# Patient Record
Sex: Female | Born: 1985 | Race: Asian | Hispanic: No | Marital: Married | Smoking: Never smoker
Health system: Southern US, Community
[De-identification: ages and names within clinical notes are randomized; demographics above are authoritative.]

## PROBLEM LIST (undated history)

## (undated) DIAGNOSIS — O24419 Gestational diabetes mellitus in pregnancy, unspecified control: Secondary | ICD-10-CM

## (undated) DIAGNOSIS — Z789 Other specified health status: Secondary | ICD-10-CM

## (undated) HISTORY — DX: Gestational diabetes mellitus in pregnancy, unspecified control: O24.419

## (undated) HISTORY — PX: APPENDECTOMY: SHX54

---

## 2011-12-02 ENCOUNTER — Ambulatory Visit: Payer: Self-pay | Admitting: Physician Assistant

## 2011-12-02 VITALS — BP 110/75 | HR 109 | Temp 98.4°F | Resp 16 | Ht 60.0 in | Wt 95.0 lb

## 2011-12-02 DIAGNOSIS — N912 Amenorrhea, unspecified: Secondary | ICD-10-CM

## 2011-12-02 DIAGNOSIS — Z331 Pregnant state, incidental: Secondary | ICD-10-CM

## 2011-12-02 DIAGNOSIS — Z3490 Encounter for supervision of normal pregnancy, unspecified, unspecified trimester: Secondary | ICD-10-CM

## 2011-12-02 DIAGNOSIS — J029 Acute pharyngitis, unspecified: Secondary | ICD-10-CM

## 2011-12-02 DIAGNOSIS — J069 Acute upper respiratory infection, unspecified: Secondary | ICD-10-CM

## 2011-12-02 MED ORDER — PRENATAL VITAMINS (DIS) PO TABS: ORAL_TABLET | ORAL | Status: AC

## 2011-12-02 NOTE — Progress Notes (Signed)
Patient ID: Lisa Christensen MRN: [id number], DOB: 1985/09/14, 26 y.o. Date of Encounter: 12/02/2011, 2:18 PM  Primary Physician: No primary provider on file.  Chief Complaint:  Chief Complaint  Patient presents with  . URI    x 4-5 days    HPI: 26 y.o. year old female recently moved here from [place] presents with 4-5 day history of nasal congestion, post nasal drip, sore throat, sinus pressure, and cough. Afebrile. No chills. Nasal congestion thick and green/yellow. Cough is sometimes productive of green sputum and not associated with time of day. Has not tried any medications at home due to a recent positive at home pregnancy test. No GI complaints. Appetite slightly decreased. Plans to marry boyfriend in the room with her currently. Boyfriend with similar symptoms prior, now he is feeling better. No recent antibiotics. No hemoptysis, night sweats, prolonged cough, or fatigue. Generally healthy. She immigrated to the [place] on her own free will and is here today on her own free will. She does feel safe. No abdominal pain or bleeding.    No leg trauma, sedentary periods, h/o cancer, or tobacco use.   History reviewed. No pertinent past medical history.   Home Meds: Prior to Admission medications   Not on File    Allergies: No Known Allergies  History   Social History  . Marital Status: Significant Other    Spouse Name: N/A    Number of Children: N/A  . Years of Education: N/A   Occupational History  . Not on file.   Social History Main Topics  . Smoking status: Never Smoker   . Smokeless tobacco: Never Used  . Alcohol Use: No  . Drug Use: No  . Sexually Active: Yes    Birth Control/ Protection: None   Other Topics Concern  . Not on file   Social History Narrative  . No narrative on file     Review of Systems: Constitutional: negative for chills, fever, night sweats or weight changes Cardiovascular: negative for chest pain or palpitations Respiratory:  negative for hemoptysis, wheezing, dyspnea, or shortness of breath Abdominal: negative for abdominal pain, nausea, vomiting or diarrhea Dermatological: negative for rash Neurologic: negative for headache   Physical Exam: Blood pressure 110/75, pulse 109, temperature 98.4 F (36.9 C), temperature source Oral, resp. rate 16, height 5' (1.524 m), weight 95 lb (43.092 kg), last menstrual period 10/17/2011, SpO2 98.00%., Body mass index is 18.55 kg/(m^2). General: Well developed, well nourished, in no acute distress. Head: Normocephalic, atraumatic, eyes without discharge, sclera non-icteric, nares are congested. Bilateral auditory canals clear, TM's are without perforation, pearly grey with reflective cone of light bilaterally. No sinus TTP. Oral cavity moist, dentition normal. Posterior pharynx with post nasal drip and mild erythema. No peritonsillar abscess or tonsillar exudate. Uvula midline. Neck: Supple. No thyromegaly. Full ROM. No lymphadenopathy. Lungs: Clear bilaterally to auscultation without wheezes, rales, or rhonchi. Breathing is unlabored.  Heart: RRR with S1 S2. No murmurs, rubs, or gallops appreciated. Msk:  Strength and tone normal for age. Extremities: No clubbing or cyanosis. No edema. Neuro: Alert and oriented X 3. Moves all extremities spontaneously. CNII-XII grossly in tact. Psych:  Responds to questions appropriately with a normal affect.   Labs: Results for orders placed in visit on 12/02/11  POCT RAPID STREP A (OFFICE)      Component Value Range   Rapid Strep A Screen Negative  Negative  POCT URINE PREGNANCY      Component Value Range  Preg Test, Ur Positive     Throat culture pending  ASSESSMENT AND PLAN:  26 y.o. year old female with viral URI and pregnancy.   1. Viral URI -Benadryl -Tylenol -Rest/fluids -Await culture results -RTC precautions -RTC 3-5 days if no improvement  2. Pregnancy -Prenatal vitamins 1 po daily #30 RF 11 -Discussed medications  to avoid -Names and numbers given to [facility] and [facility] -Discussed with Dr. Perrin Maltese  Signed, Eula Listen, PA-C 12/02/2011 2:18 PM

## 2012-02-09 LAB — OB RESULTS CONSOLE ABO/RH: RH Type: POSITIVE

## 2012-02-09 LAB — OB RESULTS CONSOLE RUBELLA ANTIBODY, IGM: Rubella: IMMUNE

## 2012-02-09 LAB — OB RESULTS CONSOLE ANTIBODY SCREEN: Antibody Screen: NEGATIVE

## 2012-02-09 LAB — OB RESULTS CONSOLE HEPATITIS B SURFACE ANTIGEN: Hepatitis B Surface Ag: NEGATIVE

## 2012-07-28 ENCOUNTER — Inpatient Hospital Stay (HOSPITAL_COMMUNITY): Payer: BC Managed Care – PPO | Admitting: Anesthesiology

## 2012-07-28 ENCOUNTER — Inpatient Hospital Stay (HOSPITAL_COMMUNITY)
Admission: AD | Admit: 2012-07-28 | Discharge: 2012-07-30 | DRG: 373 | Disposition: A | Discharge status: Home / Self Care | Payer: BC Managed Care – PPO | Source: Ambulatory Visit | Attending: Obstetrics and Gynecology | Admitting: Obstetrics and Gynecology

## 2012-07-28 ENCOUNTER — Encounter (HOSPITAL_COMMUNITY): Payer: Self-pay | Admitting: Anesthesiology

## 2012-07-28 ENCOUNTER — Encounter (HOSPITAL_COMMUNITY): Payer: Self-pay | Admitting: *Deleted

## 2012-07-28 HISTORY — DX: Other specified health status: Z78.9

## 2012-07-28 LAB — CBC
Hemoglobin: 13.4 g/dL (ref 12.0–15.0)
Platelets: 160 10*3/uL (ref 150–400)
RBC: 4.31 MIL/uL (ref 3.87–5.11)
WBC: 8.3 10*3/uL (ref 4.0–10.5)

## 2012-07-28 LAB — TYPE AND SCREEN
ABO/RH(D): B POS
Antibody Screen: NEGATIVE

## 2012-07-28 LAB — ABO/RH: ABO/RH(D): B POS

## 2012-07-28 LAB — RPR: RPR Ser Ql: NONREACTIVE

## 2012-07-28 MED ORDER — LACTATED RINGERS IV SOLN: 500.0000 mL | INTRAVENOUS | Status: DC | PRN

## 2012-07-28 MED ORDER — PHENYLEPHRINE 40 MCG/ML (10ML) SYRINGE FOR IV PUSH (FOR BLOOD PRESSURE SUPPORT)
80.0000 ug | PREFILLED_SYRINGE | INTRAVENOUS | Status: DC | PRN
  Filled 2012-07-28: qty 2

## 2012-07-28 MED ORDER — LACTATED RINGERS IV SOLN
INTRAVENOUS | Status: DC
  Administered 2012-07-28 (×2): via INTRAVENOUS

## 2012-07-28 MED ORDER — OXYTOCIN BOLUS FROM INFUSION
500.0000 mL | INTRAVENOUS | Status: DC
  Administered 2012-07-28: 500 mL via INTRAVENOUS

## 2012-07-28 MED ORDER — OXYTOCIN 40 UNITS IN LACTATED RINGERS INFUSION - SIMPLE MED
62.5000 mL/h | INTRAVENOUS | Status: DC
  Filled 2012-07-28: qty 1000

## 2012-07-28 MED ORDER — DIBUCAINE 1 % RE OINT
1.0000 "application " | TOPICAL_OINTMENT | RECTAL | Status: DC | PRN
  Administered 2012-07-28: 1 via RECTAL
  Filled 2012-07-28: qty 28

## 2012-07-28 MED ORDER — BUTORPHANOL TARTRATE 1 MG/ML IJ SOLN: 1.0000 mg | INTRAMUSCULAR | Status: DC | PRN

## 2012-07-28 MED ORDER — ONDANSETRON HCL 4 MG/2ML IJ SOLN: 4.0000 mg | INTRAMUSCULAR | Status: DC | PRN

## 2012-07-28 MED ORDER — FLEET ENEMA 7-19 GM/118ML RE ENEM: 1.0000 | ENEMA | RECTAL | Status: DC | PRN

## 2012-07-28 MED ORDER — BENZOCAINE-MENTHOL 20-0.5 % EX AERO
1.0000 "application " | INHALATION_SPRAY | CUTANEOUS | Status: DC | PRN
  Administered 2012-07-28: 1 via TOPICAL
  Filled 2012-07-28: qty 56

## 2012-07-28 MED ORDER — ACETAMINOPHEN 325 MG PO TABS: 650.0000 mg | ORAL_TABLET | ORAL | Status: DC | PRN

## 2012-07-28 MED ORDER — SENNOSIDES-DOCUSATE SODIUM 8.6-50 MG PO TABS
2.0000 | ORAL_TABLET | Freq: Every day | ORAL | Status: DC
  Administered 2012-07-28 – 2012-07-29 (×2): 2 via ORAL

## 2012-07-28 MED ORDER — IBUPROFEN 600 MG PO TABS
600.0000 mg | ORAL_TABLET | Freq: Four times a day (QID) | ORAL | Status: DC
  Administered 2012-07-29 – 2012-07-30 (×4): 600 mg via ORAL
  Filled 2012-07-28 (×5): qty 1

## 2012-07-28 MED ORDER — DIPHENHYDRAMINE HCL 50 MG/ML IJ SOLN: 12.5000 mg | INTRAMUSCULAR | Status: DC | PRN

## 2012-07-28 MED ORDER — CITRIC ACID-SODIUM CITRATE 334-500 MG/5ML PO SOLN: 30.0000 mL | ORAL | Status: DC | PRN

## 2012-07-28 MED ORDER — DIPHENHYDRAMINE HCL 25 MG PO CAPS: 25.0000 mg | ORAL_CAPSULE | Freq: Four times a day (QID) | ORAL | Status: DC | PRN

## 2012-07-28 MED ORDER — BISACODYL 10 MG RE SUPP: 10.0000 mg | Freq: Every day | RECTAL | Status: DC | PRN

## 2012-07-28 MED ORDER — FENTANYL 2.5 MCG/ML BUPIVACAINE 1/10 % EPIDURAL INFUSION (WH - ANES)
14.0000 mL/h | INTRAMUSCULAR | Status: DC | PRN
  Administered 2012-07-28: 11 mL/h via EPIDURAL
  Filled 2012-07-28: qty 125

## 2012-07-28 MED ORDER — SIMETHICONE 80 MG PO CHEW: 80.0000 mg | CHEWABLE_TABLET | ORAL | Status: DC | PRN

## 2012-07-28 MED ORDER — IBUPROFEN 600 MG PO TABS: 600.0000 mg | ORAL_TABLET | Freq: Four times a day (QID) | ORAL | Status: DC | PRN

## 2012-07-28 MED ORDER — EPHEDRINE 5 MG/ML INJ
10.0000 mg | INTRAVENOUS | Status: DC | PRN
  Filled 2012-07-28: qty 2

## 2012-07-28 MED ORDER — TETANUS-DIPHTH-ACELL PERTUSSIS 5-2.5-18.5 LF-MCG/0.5 IM SUSP: 0.5000 mL | Freq: Once | INTRAMUSCULAR | Status: DC

## 2012-07-28 MED ORDER — LANOLIN HYDROUS EX OINT: TOPICAL_OINTMENT | CUTANEOUS | Status: DC | PRN

## 2012-07-28 MED ORDER — ONDANSETRON HCL 4 MG PO TABS: 4.0000 mg | ORAL_TABLET | ORAL | Status: DC | PRN

## 2012-07-28 MED ORDER — FLEET ENEMA 7-19 GM/118ML RE ENEM: 1.0000 | ENEMA | Freq: Every day | RECTAL | Status: DC | PRN

## 2012-07-28 MED ORDER — PHENYLEPHRINE 40 MCG/ML (10ML) SYRINGE FOR IV PUSH (FOR BLOOD PRESSURE SUPPORT)
80.0000 ug | PREFILLED_SYRINGE | INTRAVENOUS | Status: DC | PRN
  Filled 2012-07-28: qty 5
  Filled 2012-07-28: qty 2

## 2012-07-28 MED ORDER — PRENATAL MULTIVITAMIN CH
1.0000 | ORAL_TABLET | Freq: Every day | ORAL | Status: DC
  Administered 2012-07-29: 1 via ORAL
  Filled 2012-07-28: qty 1

## 2012-07-28 MED ORDER — OXYCODONE-ACETAMINOPHEN 5-325 MG PO TABS: 1.0000 | ORAL_TABLET | ORAL | Status: DC | PRN

## 2012-07-28 MED ORDER — ONDANSETRON HCL 4 MG/2ML IJ SOLN: 4.0000 mg | Freq: Four times a day (QID) | INTRAMUSCULAR | Status: DC | PRN

## 2012-07-28 MED ORDER — ZOLPIDEM TARTRATE 5 MG PO TABS: 5.0000 mg | ORAL_TABLET | Freq: Every evening | ORAL | Status: DC | PRN

## 2012-07-28 MED ORDER — LACTATED RINGERS IV SOLN
500.0000 mL | Freq: Once | INTRAVENOUS | Status: AC
  Administered 2012-07-28: 500 mL via INTRAVENOUS

## 2012-07-28 MED ORDER — SODIUM BICARBONATE 8.4 % IV SOLN
INTRAVENOUS | Status: DC | PRN
  Administered 2012-07-28: 4 mL via EPIDURAL

## 2012-07-28 MED ORDER — WITCH HAZEL-GLYCERIN EX PADS
1.0000 "application " | MEDICATED_PAD | CUTANEOUS | Status: DC | PRN
  Administered 2012-07-28: 1 via TOPICAL

## 2012-07-28 MED ORDER — LIDOCAINE HCL (PF) 1 % IJ SOLN
30.0000 mL | INTRAMUSCULAR | Status: DC | PRN
  Administered 2012-07-28: 30 mL via SUBCUTANEOUS
  Filled 2012-07-28 (×2): qty 30

## 2012-07-28 MED ORDER — EPHEDRINE 5 MG/ML INJ
10.0000 mg | INTRAVENOUS | Status: DC | PRN
  Filled 2012-07-28: qty 4
  Filled 2012-07-28: qty 2

## 2012-07-28 NOTE — Anesthesia Procedure Notes (Signed)
Epidural Patient location during procedure: OB  Preanesthetic Checklist Completed: patient identified, site marked, surgical consent, pre-op evaluation, timeout performed, IV checked, risks and benefits discussed and monitors and equipment checked  Epidural Patient position: sitting Prep: site prepped and draped and DuraPrep Patient monitoring: continuous pulse ox and blood pressure Approach: midline Injection technique: LOR air  Needle:  Needle type: Tuohy  Needle gauge: 17 G Needle length: 9 cm and 9 Needle insertion depth: 4 cm Catheter type: closed end flexible Catheter size: 19 Gauge Catheter at skin depth: 10 cm Test dose: negative  Assessment Events: blood not aspirated, injection not painful, no injection resistance, negative IV test and no paresthesia  Additional Notes Dosing of Epidural:  1st dose, through catheter ............................................Marland Kitchen epi 1:200K + Xylocaine 40 mg  2nd dose, through catheter, after waiting 3 minutes...Marland KitchenMarland Kitchenepi 1:200K + Xylocaine 40 mg   ( 2% Xylo charted as a single dose in Epic Meds for ease of charting; actual dosing was fractionated as above, for saftey's sake)  As each dose occurred, patient was free of IV sx; and patient exhibited no evidence of SA injection.  Patient is more comfortable after epidural dosed. Please see RN's note for documentation of vital signs,and FHR which are stable.  Patient reminded not to try to ambulate with numb legs, and that an RN must be present the 1st time she attempts to get up.

## 2012-07-28 NOTE — MAU Note (Signed)
Pt states her water broke at 0350-clear fluid

## 2012-07-28 NOTE — Anesthesia Preprocedure Evaluation (Signed)

## 2012-07-28 NOTE — H&P (Signed)
Lisa Christensen is a 27 y.o. female presenting for contractions. No ROM/bleeding, no HA or vision change, no epigastric pain. Maternal Medical History:  Reason for admission: Contractions.   Contractions: Onset was 3-5 hours ago.   Frequency: regular.   Perceived severity is strong.    Fetal activity: Perceived fetal activity is normal.      OB History   Grav Para Term Preterm Abortions TAB SAB Ect Mult Living   1              Past Medical History  Diagnosis Date  . Medical history non-contributory    Past Surgical History  Procedure Laterality Date  . Appendectomy    . No past surgeries     Family History: family history is not on file. Social History:  reports that she has never smoked. She has never used smokeless tobacco. She reports that she does not drink alcohol or use illicit drugs.   Prenatal Transfer Tool  Maternal Diabetes: No Genetic Screening: Normal Maternal Ultrasounds/Referrals: Normal Fetal Ultrasounds or other Referrals:  None Maternal Substance Abuse:  No Significant Maternal Medications:  None Significant Maternal Lab Results:  None Other Comments:  None  Review of Systems  Eyes: Negative for blurred vision.  Gastrointestinal: Negative for abdominal pain.  Neurological: Negative for headaches.    Dilation: 4.5 Effacement (%): 100 Station: -1 Exam by:: Lavell Supple Blood pressure 110/79, pulse 81, temperature 98.8 F (37.1 C), temperature source Oral, resp. rate 20, height 4\' 10"  (1.473 m), weight 119 lb (53.978 kg), last menstrual period 10/11/2011. Maternal Exam:  Uterine Assessment: Contraction strength is firm.  Contraction frequency is regular.   Abdomen: Fetal presentation: vertex     Fetal Exam Fetal Monitor Review: Pattern: accelerations present.       Physical Exam  Cardiovascular: Normal rate and regular rhythm.   Respiratory: Effort normal and breath sounds normal.  GI: Soft. There is no tenderness.  Neurological: She has  normal reflexes.   4-5/C/-1/vtx AROM clear Prenatal labs: ABO, Rh: B/Positive/-- (01/30 0000) Antibody: Negative (01/30 0000) Rubella: Immune (01/30 0000) RPR: Nonreactive (01/30 0000)  HBsAg: Negative (01/30 0000)  HIV: Non-reactive (01/30 0000)  GBS: Negative (07/19 0000)   Assessment/Plan: 28 yo G1P0 at term in active labor Anticipate vaginal delivery   Deja Kaigler II,Kasee Hantz E 07/28/2012, 10:05 AM

## 2012-07-28 NOTE — Progress Notes (Signed)
Delivery Note At 6:27 PM a viable female was delivered via Vaginal, Spontaneous Delivery (Presentation: Right Occiput Anterior).  APGAR: 8, 9; weight .   Placenta status: Intact, Spontaneous.  Cord: 3 vessels with the following complications: None.  Cord pH: pending  Anesthesia: Epidural Local  Episiotomy: Median, second degree Lacerations:  Suture Repair: vicryl rapide Est. Blood Loss (mL): 400  Mom to postpartum.  Baby to nursery-stable.  Dariya Gainer II,Araminta Zorn E 07/28/2012, 6:47 PM

## 2012-07-28 NOTE — Progress Notes (Signed)
Dr Henderson Cloud updated on pts VE, FHR pattern, and contraction pattern orders received to admit pt.

## 2012-07-28 NOTE — Progress Notes (Signed)
Cx 9/C/0 FHT reactive

## 2012-07-29 LAB — CBC
HCT: 32.2 % — ABNORMAL LOW (ref 36.0–46.0)
MCH: 31.1 pg (ref 26.0–34.0)
MCHC: 34.8 g/dL (ref 30.0–36.0)
MCV: 89.4 fL (ref 78.0–100.0)
RDW: 13.8 % (ref 11.5–15.5)

## 2012-07-29 NOTE — Anesthesia Postprocedure Evaluation (Signed)
Anesthesia Post Note  Patient: Lisa Christensen  Procedure(s) Performed: * No procedures listed *  Anesthesia type: Epidural  Patient location: Mother/Baby  Post pain: Pain level controlled  Post assessment: Post-op Vital signs reviewed  Last Vitals:  Filed Vitals:   07/29/12 0635  BP: 85/61  Pulse: 87  Temp: 36.8 C  Resp: 18    Post vital signs: Reviewed  Level of consciousness:alert  Complications: No apparent anesthesia complications Anesthesia Post-op Note  Patient: Lisa Christensen  Procedure(s) Performed: * No procedures listed *  Patient Location: PACU and Mother/Baby  Anesthesia Type:Epidural  Level of Consciousness: awake  Airway and Oxygen Therapy: Patient Spontanous Breathing  Post-op Pain: none  Post-op Assessment: Post-op Vital signs reviewed  Post-op Vital Signs: Reviewed  Complications: No apparent anesthesia complications

## 2012-07-29 NOTE — Progress Notes (Signed)
Post Partum Day 1 Subjective: no complaints, up ad lib, voiding and tolerating PO  Objective: Blood pressure 85/61, pulse 87, temperature 98.3 F (36.8 C), temperature source Oral, resp. rate 18, height 4\' 10"  (1.473 m), weight 119 lb (53.978 kg), last menstrual period 10/11/2011, SpO2 98.00%, unknown if currently breastfeeding.  Physical Exam:  General: alert, cooperative and no distress Lochia: appropriate Uterine Fundus: firm Incision: healing well DVT Evaluation: No evidence of DVT seen on physical exam.   Recent Labs  07/28/12 0800 07/29/12 0557  HGB 13.4 11.2*  HCT 38.9 32.2*    Assessment/Plan: Plan for discharge tomorrow   LOS: 1 day   Marilena Trevathan II,Aidon Klemens E 07/29/2012, 8:46 AM

## 2012-07-30 MED ORDER — SUCROSE 24% NICU/PEDS ORAL SOLUTION
0.5000 mL | OROMUCOSAL | Status: DC | PRN
  Filled 2012-07-30: qty 0.5

## 2012-07-30 MED ORDER — EPINEPHRINE TOPICAL FOR CIRCUMCISION 0.1 MG/ML: 1.0000 [drp] | TOPICAL | Status: DC | PRN

## 2012-07-30 MED ORDER — ACETAMINOPHEN FOR CIRCUMCISION 160 MG/5 ML
40.0000 mg | Freq: Once | ORAL | Status: DC
  Filled 2012-07-30: qty 2.5

## 2012-07-30 MED ORDER — LIDOCAINE 1%/NA BICARB 0.1 MEQ INJECTION
0.8000 mL | INJECTION | Freq: Once | INTRAVENOUS | Status: DC
  Filled 2012-07-30: qty 1

## 2012-07-30 MED ORDER — ACETAMINOPHEN FOR CIRCUMCISION 160 MG/5 ML
40.0000 mg | ORAL | Status: DC | PRN
  Filled 2012-07-30: qty 2.5

## 2012-07-30 MED ORDER — IBUPROFEN 600 MG PO TABS: 600.0000 mg | ORAL_TABLET | Freq: Four times a day (QID) | ORAL | Status: DC

## 2012-07-30 NOTE — Discharge Summary (Signed)
Obstetric Discharge Summary Reason for Admission: onset of labor Prenatal Procedures: ultrasound Intrapartum Procedures: spontaneous vaginal delivery Postpartum Procedures: none Complications-Operative and Postpartum: 2 degree perineal laceration Hemoglobin  Date Value Range Status  07/29/2012 11.2* 12.0 - 15.0 g/dL Final     HCT  Date Value Range Status  07/29/2012 32.2* 36.0 - 46.0 % Final    Physical Exam:  General: alert and cooperative Lochia: appropriate Uterine Fundus: firm Incision: perineum intact DVT Evaluation: No evidence of DVT seen on physical exam. Negative Homan's sign. No cords or calf tenderness. No significant calf/ankle edema.  Discharge Diagnoses: Term Pregnancy-delivered  Discharge Information: Date: 07/30/2012 Activity: pelvic rest Diet: routine Medications: PNV and Ibuprofen Condition: stable Instructions: refer to practice specific booklet Discharge to: home   Newborn Data: Live born female  Birth Weight: 8 lb 5.5 oz (3785 g) APGAR: 8, 9  Home with mother.  Lisa Christensen G 07/30/2012, 8:19 AM

## 2012-07-30 NOTE — Progress Notes (Deleted)
Patient ID: Lisa Christensen, female   DOB: [birth year], 27 y.o.   MRN: [id number] Risk of circumcision discussed with parents.  Circumcision performed using a Gomco and 1%xylocaine block without complications.

## 2013-11-11 ENCOUNTER — Encounter (HOSPITAL_COMMUNITY): Payer: Self-pay | Admitting: *Deleted

## 2014-01-10 NOTE — L&D Delivery Note (Signed)
SVD of VFI at 1722 on 09/17/14.  EBL 350cc.  APGARs 9,9.  Placenta to L&D. Head delivered OA.  Loose nuchal x 1 reduced.  Body followed atraumatically.  Baby to abdomen.  Cord blood was obtained.  Placenta delivered S/I/3VC.  Fundus was firmed with pitocin and massage.  2nd degree perineal lac was repaired with 3-0 Rapide in the normal fashion.  Mom and baby stable.  Mitchel Honour, DO

## 2014-03-19 LAB — OB RESULTS CONSOLE RUBELLA ANTIBODY, IGM: Rubella: IMMUNE

## 2014-03-19 LAB — OB RESULTS CONSOLE ANTIBODY SCREEN: ANTIBODY SCREEN: NEGATIVE

## 2014-03-19 LAB — OB RESULTS CONSOLE GC/CHLAMYDIA
Chlamydia: NEGATIVE
GC PROBE AMP, GENITAL: NEGATIVE

## 2014-03-19 LAB — OB RESULTS CONSOLE HEPATITIS B SURFACE ANTIGEN: Hepatitis B Surface Ag: NEGATIVE

## 2014-03-19 LAB — OB RESULTS CONSOLE RPR: RPR: NONREACTIVE

## 2014-03-19 LAB — OB RESULTS CONSOLE HIV ANTIBODY (ROUTINE TESTING): HIV: NONREACTIVE

## 2014-03-19 LAB — OB RESULTS CONSOLE ABO/RH: RH TYPE: POSITIVE

## 2014-07-09 ENCOUNTER — Encounter: Payer: BLUE CROSS/BLUE SHIELD | Attending: Obstetrics & Gynecology

## 2014-07-09 VITALS — Ht 60.0 in | Wt 109.7 lb

## 2014-07-09 DIAGNOSIS — O24419 Gestational diabetes mellitus in pregnancy, unspecified control: Secondary | ICD-10-CM | POA: Diagnosis present

## 2014-07-09 DIAGNOSIS — Z713 Dietary counseling and surveillance: Secondary | ICD-10-CM | POA: Diagnosis not present

## 2014-07-11 NOTE — Progress Notes (Signed)
  Patient was seen on 07/09/14 for Gestational Diabetes self-management class at the [facility]. The following learning objectives were met by the patient during this course:   States the definition of Gestational Diabetes  States why dietary management is important in controlling blood glucose  Describes the effects each nutrient has on blood glucose levels  Demonstrates ability to create a balanced meal plan  Demonstrates carbohydrate counting   States when to check blood glucose levels  Demonstrates proper blood glucose monitoring techniques  States the effect of stress and exercise on blood glucose levels  States the importance of limiting caffeine and abstaining from alcohol and smoking  Blood glucose monitor given:  One Touch VerioFlex Self Monitoring Kit Lot # [id number] x Exp: 09/2015 Blood glucose reading: 69 mg/dl  Patient instructed to monitor glucose levels: FBS: 60 - <90 2 hour: <120  *Patient received handouts:  Nutrition Diabetes and Pregnancy  Carbohydrate Counting List  Patient will be seen for follow-up as needed.

## 2014-07-31 LAB — OB RESULTS CONSOLE GBS: STREP GROUP B AG: NEGATIVE

## 2014-08-09 ENCOUNTER — Inpatient Hospital Stay (HOSPITAL_COMMUNITY): Admission: AD | Admit: 2014-08-09 | Payer: Self-pay | Source: Ambulatory Visit | Admitting: Obstetrics & Gynecology

## 2014-09-11 ENCOUNTER — Telehealth (HOSPITAL_COMMUNITY): Payer: Self-pay | Admitting: *Deleted

## 2014-09-11 ENCOUNTER — Encounter (HOSPITAL_COMMUNITY): Payer: Self-pay | Admitting: *Deleted

## 2014-09-11 NOTE — Telephone Encounter (Signed)
Preadmission screen  

## 2014-09-12 ENCOUNTER — Encounter (HOSPITAL_COMMUNITY): Payer: Self-pay | Admitting: *Deleted

## 2014-09-12 ENCOUNTER — Telehealth (HOSPITAL_COMMUNITY): Payer: Self-pay | Admitting: *Deleted

## 2014-09-12 NOTE — Telephone Encounter (Signed)
Preadmission screen  

## 2014-09-17 ENCOUNTER — Inpatient Hospital Stay (HOSPITAL_COMMUNITY): Payer: BLUE CROSS/BLUE SHIELD | Admitting: Anesthesiology

## 2014-09-17 ENCOUNTER — Inpatient Hospital Stay (HOSPITAL_COMMUNITY)
Admission: RE | Admit: 2014-09-17 | Discharge: 2014-09-19 | DRG: 775 | Disposition: A | Discharge status: Home / Self Care | Payer: BLUE CROSS/BLUE SHIELD | Source: Ambulatory Visit | Attending: Obstetrics & Gynecology | Admitting: Obstetrics & Gynecology

## 2014-09-17 ENCOUNTER — Encounter (HOSPITAL_COMMUNITY): Payer: Self-pay

## 2014-09-17 DIAGNOSIS — O480 Post-term pregnancy: Principal | ICD-10-CM | POA: Diagnosis present

## 2014-09-17 DIAGNOSIS — Z809 Family history of malignant neoplasm, unspecified: Secondary | ICD-10-CM

## 2014-09-17 DIAGNOSIS — O24410 Gestational diabetes mellitus in pregnancy, diet controlled: Secondary | ICD-10-CM | POA: Diagnosis present

## 2014-09-17 DIAGNOSIS — Z3A40 40 weeks gestation of pregnancy: Secondary | ICD-10-CM | POA: Diagnosis present

## 2014-09-17 DIAGNOSIS — Z833 Family history of diabetes mellitus: Secondary | ICD-10-CM

## 2014-09-17 DIAGNOSIS — Z3490 Encounter for supervision of normal pregnancy, unspecified, unspecified trimester: Secondary | ICD-10-CM

## 2014-09-17 LAB — CBC
HEMATOCRIT: 35.1 % — AB (ref 36.0–46.0)
HEMOGLOBIN: 11.9 g/dL — AB (ref 12.0–15.0)
MCH: 31.1 pg (ref 26.0–34.0)
MCHC: 33.9 g/dL (ref 30.0–36.0)
MCV: 91.6 fL (ref 78.0–100.0)
Platelets: 131 10*3/uL — ABNORMAL LOW (ref 150–400)
RBC: 3.83 MIL/uL — ABNORMAL LOW (ref 3.87–5.11)
RDW: 14.1 % (ref 11.5–15.5)
WBC: 6.5 10*3/uL (ref 4.0–10.5)

## 2014-09-17 LAB — TYPE AND SCREEN
ABO/RH(D): B POS
ANTIBODY SCREEN: NEGATIVE

## 2014-09-17 LAB — GLUCOSE, CAPILLARY
Glucose-Capillary: 96 mg/dL (ref 65–99)
Glucose-Capillary: 72 mg/dL (ref 65–99)
Glucose-Capillary: 72 mg/dL (ref 65–99)

## 2014-09-17 LAB — RPR: RPR Ser Ql: NONREACTIVE

## 2014-09-17 MED ORDER — ONDANSETRON HCL 4 MG/2ML IJ SOLN: 4.0000 mg | Freq: Four times a day (QID) | INTRAMUSCULAR | Status: DC | PRN

## 2014-09-17 MED ORDER — SIMETHICONE 80 MG PO CHEW: 80.0000 mg | CHEWABLE_TABLET | ORAL | Status: DC | PRN

## 2014-09-17 MED ORDER — OXYCODONE-ACETAMINOPHEN 5-325 MG PO TABS: 2.0000 | ORAL_TABLET | ORAL | Status: DC | PRN

## 2014-09-17 MED ORDER — WITCH HAZEL-GLYCERIN EX PADS: 1.0000 "application " | MEDICATED_PAD | CUTANEOUS | Status: DC | PRN

## 2014-09-17 MED ORDER — LACTATED RINGERS IV SOLN
500.0000 mL | INTRAVENOUS | Status: DC | PRN
  Administered 2014-09-17: 500 mL via INTRAVENOUS

## 2014-09-17 MED ORDER — DIPHENHYDRAMINE HCL 50 MG/ML IJ SOLN: 12.5000 mg | INTRAMUSCULAR | Status: DC | PRN

## 2014-09-17 MED ORDER — TERBUTALINE SULFATE 1 MG/ML IJ SOLN
0.2500 mg | Freq: Once | INTRAMUSCULAR | Status: DC | PRN
Start: 2014-09-17 — End: 2014-09-17
  Filled 2014-09-17: qty 1

## 2014-09-17 MED ORDER — DIPHENHYDRAMINE HCL 25 MG PO CAPS: 25.0000 mg | ORAL_CAPSULE | Freq: Four times a day (QID) | ORAL | Status: DC | PRN

## 2014-09-17 MED ORDER — OXYCODONE-ACETAMINOPHEN 5-325 MG PO TABS
2.0000 | ORAL_TABLET | ORAL | Status: DC | PRN
  Administered 2014-09-18: 2 via ORAL
  Filled 2014-09-17: qty 2

## 2014-09-17 MED ORDER — LACTATED RINGERS IV SOLN
INTRAVENOUS | Status: DC
  Administered 2014-09-17: 08:00:00 via INTRAVENOUS

## 2014-09-17 MED ORDER — IBUPROFEN 600 MG PO TABS
600.0000 mg | ORAL_TABLET | Freq: Four times a day (QID) | ORAL | Status: DC
  Administered 2014-09-18 – 2014-09-19 (×6): 600 mg via ORAL
  Filled 2014-09-17 (×6): qty 1

## 2014-09-17 MED ORDER — BENZOCAINE-MENTHOL 20-0.5 % EX AERO
1.0000 "application " | INHALATION_SPRAY | CUTANEOUS | Status: DC | PRN
  Administered 2014-09-18: 1 via TOPICAL
  Filled 2014-09-17: qty 56

## 2014-09-17 MED ORDER — ONDANSETRON HCL 4 MG PO TABS: 4.0000 mg | ORAL_TABLET | ORAL | Status: DC | PRN

## 2014-09-17 MED ORDER — CITRIC ACID-SODIUM CITRATE 334-500 MG/5ML PO SOLN
30.0000 mL | ORAL | Status: DC | PRN
Start: 2014-09-17 — End: 2014-09-17

## 2014-09-17 MED ORDER — OXYTOCIN 40 UNITS IN LACTATED RINGERS INFUSION - SIMPLE MED
1.0000 m[IU]/min | INTRAVENOUS | Status: DC
  Administered 2014-09-17: 2 m[IU]/min via INTRAVENOUS

## 2014-09-17 MED ORDER — SENNOSIDES-DOCUSATE SODIUM 8.6-50 MG PO TABS
2.0000 | ORAL_TABLET | ORAL | Status: DC
  Administered 2014-09-18 (×2): 2 via ORAL
  Filled 2014-09-17 (×2): qty 2

## 2014-09-17 MED ORDER — LIDOCAINE HCL (PF) 1 % IJ SOLN
INTRAMUSCULAR | Status: DC | PRN
  Administered 2014-09-17 (×2): 5 mL via EPIDURAL

## 2014-09-17 MED ORDER — EPHEDRINE 5 MG/ML INJ
10.0000 mg | INTRAVENOUS | Status: DC | PRN
  Filled 2014-09-17: qty 2

## 2014-09-17 MED ORDER — DIBUCAINE 1 % RE OINT: 1.0000 "application " | TOPICAL_OINTMENT | RECTAL | Status: DC | PRN

## 2014-09-17 MED ORDER — LANOLIN HYDROUS EX OINT: TOPICAL_OINTMENT | CUTANEOUS | Status: DC | PRN

## 2014-09-17 MED ORDER — ACETAMINOPHEN 325 MG PO TABS: 650.0000 mg | ORAL_TABLET | ORAL | Status: DC | PRN

## 2014-09-17 MED ORDER — BUTORPHANOL TARTRATE 1 MG/ML IJ SOLN
1.0000 mg | INTRAMUSCULAR | Status: DC | PRN
Start: 2014-09-17 — End: 2014-09-17

## 2014-09-17 MED ORDER — FENTANYL 2.5 MCG/ML BUPIVACAINE 1/10 % EPIDURAL INFUSION (WH - ANES)
14.0000 mL/h | INTRAMUSCULAR | Status: DC | PRN
  Administered 2014-09-17 (×2): 14 mL/h via EPIDURAL
  Filled 2014-09-17: qty 125

## 2014-09-17 MED ORDER — PRENATAL MULTIVITAMIN CH
1.0000 | ORAL_TABLET | Freq: Every day | ORAL | Status: DC
  Administered 2014-09-18: 1 via ORAL
  Filled 2014-09-17: qty 1

## 2014-09-17 MED ORDER — FLEET ENEMA 7-19 GM/118ML RE ENEM: 1.0000 | ENEMA | RECTAL | Status: DC | PRN

## 2014-09-17 MED ORDER — OXYCODONE-ACETAMINOPHEN 5-325 MG PO TABS: 1.0000 | ORAL_TABLET | ORAL | Status: DC | PRN

## 2014-09-17 MED ORDER — OXYTOCIN 40 UNITS IN LACTATED RINGERS INFUSION - SIMPLE MED
62.5000 mL/h | INTRAVENOUS | Status: DC
  Filled 2014-09-17: qty 1000

## 2014-09-17 MED ORDER — ONDANSETRON HCL 4 MG/2ML IJ SOLN: 4.0000 mg | INTRAMUSCULAR | Status: DC | PRN

## 2014-09-17 MED ORDER — OXYTOCIN BOLUS FROM INFUSION
500.0000 mL | INTRAVENOUS | Status: DC
  Administered 2014-09-17: 500 mL via INTRAVENOUS

## 2014-09-17 MED ORDER — PHENYLEPHRINE 40 MCG/ML (10ML) SYRINGE FOR IV PUSH (FOR BLOOD PRESSURE SUPPORT)
80.0000 ug | PREFILLED_SYRINGE | INTRAVENOUS | Status: DC | PRN
  Filled 2014-09-17: qty 20
  Filled 2014-09-17: qty 2

## 2014-09-17 MED ORDER — LIDOCAINE HCL (PF) 1 % IJ SOLN
30.0000 mL | INTRAMUSCULAR | Status: DC | PRN
Start: 2014-09-17 — End: 2014-09-17
  Filled 2014-09-17: qty 30

## 2014-09-17 MED ORDER — OXYCODONE-ACETAMINOPHEN 5-325 MG PO TABS
1.0000 | ORAL_TABLET | ORAL | Status: DC | PRN
  Administered 2014-09-18: 1 via ORAL
  Filled 2014-09-17: qty 1

## 2014-09-17 MED ORDER — ZOLPIDEM TARTRATE 5 MG PO TABS: 5.0000 mg | ORAL_TABLET | Freq: Every evening | ORAL | Status: DC | PRN

## 2014-09-17 MED ORDER — TETANUS-DIPHTH-ACELL PERTUSSIS 5-2.5-18.5 LF-MCG/0.5 IM SUSP: 0.5000 mL | Freq: Once | INTRAMUSCULAR | Status: DC

## 2014-09-17 NOTE — H&P (Addendum)
Lisa Christensen is a 29 y.o. female presenting for IOL.  Antepartum course complicated by GDM; diet controlled.  GBS negative.  Maternal Medical History:  Fetal activity: Perceived fetal activity is normal.   Last perceived fetal movement was within the past hour.    Prenatal complications: no prenatal complications Prenatal Complications - Diabetes: gestational. Diabetes is managed by diet.      OB History    Gravida Para Term Preterm AB TAB SAB Ectopic Multiple Living   Past Medical History  Diagnosis Date  . Medical history non-contributory   . GDM (gestational diabetes mellitus)   . Gestational diabetes     diet controlled   Past Surgical History  Procedure Laterality Date  . Appendectomy     Family History: family history includes Cancer in her paternal aunt; Diabetes in her mother. Social History:  reports that she has never smoked. She has never used smokeless tobacco. She reports that she does not drink alcohol or use illicit drugs.   Prenatal Transfer Tool  Maternal Diabetes: Yes:  Diabetes Type:  Diet controlled Genetic Screening: Normal Maternal Ultrasounds/Referrals: Normal Fetal Ultrasounds or other Referrals:  None Maternal Substance Abuse:  No Significant Maternal Medications:  None Significant Maternal Lab Results:  Lab values include: Group B Strep negative Other Comments:  None  ROS    Blood pressure 93/63, pulse 99, temperature 98.4 F (36.9 C), temperature source Oral, resp. rate 18, height 5' (1.524 m), weight 121 lb (54.885 kg), last menstrual period 12/08/2013, unknown if currently breastfeeding. Maternal Exam:  Uterine Assessment: Contraction strength is mild.  Contraction frequency is rare.   Abdomen: Patient reports no abdominal tenderness. Fundal height is c/w dates.   Estimated fetal weight is 7#6.       Physical Exam  Constitutional: She is oriented to person, place, and time. She appears well-developed and  well-nourished.  GI: Soft. There is no rebound and no guarding.  Neurological: She is alert and oriented to person, place, and time.  Skin: Skin is warm and dry.  Psychiatric: She has a normal mood and affect. Her behavior is normal.    Prenatal labs: ABO, Rh: B/Positive/-- (03/09 0000) Antibody: Negative (03/09 0000) Rubella: Immune (03/09 0000) RPR: Nonreactive (03/09 0000)  HBsAg: Negative (03/09 0000)  HIV: Non-reactive (03/09 0000)  GBS: Negative (07/21 0000)   Assessment/Plan: 28yo G2P1001 at [redacted]w[redacted]d for IOL -Will start pitocin -Epidural when ready -Anticipate NSVD -AROM when able -CBG q 4 h  Lisa Christensen 09/17/2014, 8:36 AM

## 2014-09-17 NOTE — Progress Notes (Deleted)
OK to remove epidural catheter; removed at 1825 intact.

## 2014-09-17 NOTE — Anesthesia Procedure Notes (Signed)
Epidural Patient location during procedure: OB Start time: 09/17/2014 3:34 PM End time: 09/17/2014 3:42 PM  Staffing Anesthesiologist: Shona Simpson D Performed by: anesthesiologist   Preanesthetic Checklist Completed: patient identified, site marked, surgical consent, pre-op evaluation, timeout performed, IV checked, risks and benefits discussed and monitors and equipment checked  Epidural Patient position: sitting Prep: DuraPrep Patient monitoring: heart rate, continuous pulse ox and blood pressure Approach: midline Location: L3-L4 Injection technique: LOR saline  Needle:  Needle type: Tuohy  Needle gauge: 18 G Needle length: 9 cm and 9 Catheter type: closed end flexible Catheter size: 20 Guage Test dose: negative and Other  Assessment Events: blood not aspirated, injection not painful, no injection resistance, negative IV test and no paresthesia  Additional Notes LOR @ 4  Patient tolerated the insertion well without complications.Reason for block:procedure for pain

## 2014-09-17 NOTE — Progress Notes (Signed)
OK to remove epidural catheter per anesthesia; D/C'd at 1825 intact

## 2014-09-17 NOTE — Anesthesia Preprocedure Evaluation (Signed)
Anesthesia Evaluation  Patient identified by MRN, date of birth, ID band Patient awake    Reviewed: Allergy & Precautions, NPO status , Patient's Chart, lab work & pertinent test results  Airway Mallampati: I  TM Distance: >3 FB Neck ROM: Full    Dental  (+) Teeth Intact   Pulmonary neg pulmonary ROS,    breath sounds clear to auscultation       Cardiovascular negative cardio ROS   Rhythm:Regular Rate:Normal     Neuro/Psych negative neurological ROS  negative psych ROS   GI/Hepatic negative GI ROS, Neg liver ROS,   Endo/Other  diabetes, Gestational  Renal/GU negative Renal ROS  negative genitourinary   Musculoskeletal negative musculoskeletal ROS (+)   Abdominal   Peds negative pediatric ROS (+)  Hematology negative hematology ROS (+)   Anesthesia Other Findings   Reproductive/Obstetrics (+) Pregnancy                             Lab Results  Component Value Date   WBC 6.5 09/17/2014   HGB 11.9* 09/17/2014   HCT 35.1* 09/17/2014   MCV 91.6 09/17/2014   PLT 131* 09/17/2014     Anesthesia Physical Anesthesia Plan  ASA: II  Anesthesia Plan: Epidural   Post-op Pain Management:    Induction:   Airway Management Planned:   Additional Equipment:   Intra-op Plan:   Post-operative Plan:   Informed Consent: I have reviewed the patients History and Physical, chart, labs and discussed the procedure including the risks, benefits and alternatives for the proposed anesthesia with the patient or authorized representative who has indicated his/her understanding and acceptance.   Dental advisory given  Plan Discussed with: Anesthesiologist  Anesthesia Plan Comments:         Anesthesia Quick Evaluation

## 2014-09-18 LAB — CBC
HCT: 29.8 % — ABNORMAL LOW (ref 36.0–46.0)
HEMOGLOBIN: 10.2 g/dL — AB (ref 12.0–15.0)
MCH: 31.2 pg (ref 26.0–34.0)
MCHC: 34.2 g/dL (ref 30.0–36.0)
MCV: 91.1 fL (ref 78.0–100.0)
Platelets: 113 10*3/uL — ABNORMAL LOW (ref 150–400)
RBC: 3.27 MIL/uL — ABNORMAL LOW (ref 3.87–5.11)
RDW: 14 % (ref 11.5–15.5)
WBC: 10.3 10*3/uL (ref 4.0–10.5)

## 2014-09-18 NOTE — Anesthesia Postprocedure Evaluation (Signed)
  Anesthesia Post-op Note  Patient: Lisa Christensen  Procedure(s) Performed: * No procedures listed *  Patient Location: PACU and Mother/Baby  Anesthesia Type:Epidural  Level of Consciousness: awake, alert  and oriented  Airway and Oxygen Therapy: Patient Spontanous Breathing  Post-op Pain: mild  Post-op Assessment: Patient's Cardiovascular Status Stable, Respiratory Function Stable, No signs of Nausea or vomiting, Adequate PO intake, Pain level controlled, No headache, No backache and Patient able to bend at knees              Post-op Vital Signs: Reviewed and stable  Last Vitals:  Filed Vitals:   09/18/14 0623  BP: 82/56  Pulse: 72  Temp: 36.8 C  Resp: 18    Complications: No apparent anesthesia complications

## 2014-09-18 NOTE — Progress Notes (Signed)
Post Partum Day 1 Subjective: no complaints, up ad lib, voiding and tolerating PO  Objective: Blood pressure 82/56, pulse 72, temperature 98.3 F (36.8 C), temperature source Oral, resp. rate 18, height 5' (1.524 m), weight 121 lb (54.885 kg), last menstrual period 12/08/2013, SpO2 99 %, unknown if currently breastfeeding.  Physical Exam:  General: alert and cooperative Lochia: appropriate Uterine Fundus: firm Incision: healing well DVT Evaluation: No evidence of DVT seen on physical exam. Negative Homan's sign. No cords or calf tenderness. No significant calf/ankle edema.   Recent Labs  09/17/14 0800 09/18/14 0530  HGB 11.9* 10.2*  HCT 35.1* 29.8*    Assessment/Plan: Plan for discharge tomorrow   LOS: 1 day   Lisa Christensen G 09/18/2014, 8:23 AM

## 2014-09-19 MED ORDER — IBUPROFEN 600 MG PO TABS: 600.0000 mg | ORAL_TABLET | Freq: Four times a day (QID) | ORAL | Status: AC

## 2014-09-19 MED ORDER — OXYCODONE-ACETAMINOPHEN 5-325 MG PO TABS: 1.0000 | ORAL_TABLET | ORAL | Status: AC | PRN

## 2014-09-19 NOTE — Discharge Summary (Signed)
Obstetric Discharge Summary Reason for Admission: induction of labor Prenatal Procedures: ultrasound Intrapartum Procedures: spontaneous vaginal delivery Postpartum Procedures: none Complications-Operative and Postpartum: 2 degree perineal laceration HEMOGLOBIN  Date Value Ref Range Status  09/18/2014 10.2* 12.0 - 15.0 g/dL Final   HCT  Date Value Ref Range Status  09/18/2014 29.8* 36.0 - 46.0 % Final    Physical Exam:  General: alert and cooperative Lochia: appropriate Uterine Fundus: firm Incision: healing well DVT Evaluation: No evidence of DVT seen on physical exam. Negative Homan's sign. No cords or calf tenderness. No significant calf/ankle edema.  Discharge Diagnoses: Term Pregnancy-delivered  Discharge Information: Date: 09/19/2014 Activity: pelvic rest Diet: routine Medications: PNV, Ibuprofen and Percocet Condition: stable Instructions: refer to practice specific booklet Discharge to: home   Newborn Data: Live born female  Birth Weight: 7 lb 6.3 oz (3355 g) APGAR: 9, 9  Home with mother.  CURTIS,CAROL G 09/19/2014, 7:26 AM

## 2017-05-24 ENCOUNTER — Other Ambulatory Visit: Payer: Self-pay | Admitting: Obstetrics & Gynecology

## 2017-05-24 DIAGNOSIS — N6310 Unspecified lump in the right breast, unspecified quadrant: Secondary | ICD-10-CM

## 2017-05-26 ENCOUNTER — Other Ambulatory Visit: Payer: Self-pay | Admitting: Obstetrics & Gynecology

## 2017-05-26 ENCOUNTER — Ambulatory Visit
Admission: RE | Admit: 2017-05-26 | Discharge: 2017-05-26 | Disposition: A | Discharge status: Home / Self Care | Payer: 59 | Source: Ambulatory Visit | Attending: Obstetrics & Gynecology | Admitting: Obstetrics & Gynecology

## 2017-05-26 DIAGNOSIS — N6310 Unspecified lump in the right breast, unspecified quadrant: Secondary | ICD-10-CM

## 2017-11-27 ENCOUNTER — Other Ambulatory Visit: Payer: 59

## 2017-12-01 ENCOUNTER — Other Ambulatory Visit: Payer: 59

## 2017-12-18 ENCOUNTER — Other Ambulatory Visit: Payer: Self-pay | Admitting: Obstetrics & Gynecology

## 2017-12-18 ENCOUNTER — Ambulatory Visit
Admission: RE | Admit: 2017-12-18 | Discharge: 2017-12-18 | Disposition: A | Discharge status: Home / Self Care | Payer: 59 | Source: Ambulatory Visit | Attending: Obstetrics & Gynecology | Admitting: Obstetrics & Gynecology

## 2017-12-18 DIAGNOSIS — N6310 Unspecified lump in the right breast, unspecified quadrant: Secondary | ICD-10-CM

## 2018-06-21 ENCOUNTER — Inpatient Hospital Stay: Admission: RE | Admit: 2018-06-21 | Payer: 59 | Source: Ambulatory Visit

## 2018-09-25 ENCOUNTER — Other Ambulatory Visit: Payer: Self-pay | Admitting: Obstetrics & Gynecology

## 2018-09-25 DIAGNOSIS — N6310 Unspecified lump in the right breast, unspecified quadrant: Secondary | ICD-10-CM

## 2018-09-26 ENCOUNTER — Other Ambulatory Visit: Payer: 59

## 2018-09-27 ENCOUNTER — Ambulatory Visit
Admission: RE | Admit: 2018-09-27 | Discharge: 2018-09-27 | Disposition: A | Discharge status: Home / Self Care | Payer: 59 | Source: Ambulatory Visit | Attending: Obstetrics & Gynecology | Admitting: Obstetrics & Gynecology

## 2018-09-27 ENCOUNTER — Other Ambulatory Visit: Payer: Self-pay

## 2018-09-27 DIAGNOSIS — N6310 Unspecified lump in the right breast, unspecified quadrant: Secondary | ICD-10-CM

## 2019-01-31 ENCOUNTER — Other Ambulatory Visit: Payer: Self-pay | Admitting: Obstetrics & Gynecology

## 2019-01-31 DIAGNOSIS — N6325 Unspecified lump in the left breast, overlapping quadrants: Secondary | ICD-10-CM

## 2019-02-01 ENCOUNTER — Ambulatory Visit
Admission: RE | Admit: 2019-02-01 | Discharge: 2019-02-01 | Disposition: A | Discharge status: Home / Self Care | Payer: 59 | Source: Ambulatory Visit | Attending: Obstetrics & Gynecology | Admitting: Obstetrics & Gynecology

## 2019-02-01 ENCOUNTER — Other Ambulatory Visit: Payer: Self-pay

## 2019-02-01 ENCOUNTER — Other Ambulatory Visit: Payer: Self-pay | Admitting: Obstetrics & Gynecology

## 2019-02-01 DIAGNOSIS — N6320 Unspecified lump in the left breast, unspecified quadrant: Secondary | ICD-10-CM

## 2019-02-01 DIAGNOSIS — N6325 Unspecified lump in the left breast, overlapping quadrants: Secondary | ICD-10-CM

## 2019-05-06 ENCOUNTER — Other Ambulatory Visit: Payer: Self-pay | Admitting: Obstetrics & Gynecology

## 2019-05-06 ENCOUNTER — Other Ambulatory Visit: Payer: Self-pay

## 2019-05-06 ENCOUNTER — Ambulatory Visit
Admission: RE | Admit: 2019-05-06 | Discharge: 2019-05-06 | Disposition: A | Discharge status: Home / Self Care | Payer: 59 | Source: Ambulatory Visit | Attending: Obstetrics & Gynecology | Admitting: Obstetrics & Gynecology

## 2019-05-06 DIAGNOSIS — N6320 Unspecified lump in the left breast, unspecified quadrant: Secondary | ICD-10-CM

## 2019-05-06 DIAGNOSIS — N6310 Unspecified lump in the right breast, unspecified quadrant: Secondary | ICD-10-CM

## 2019-11-06 ENCOUNTER — Ambulatory Visit
Admission: RE | Admit: 2019-11-06 | Discharge: 2019-11-06 | Disposition: A | Discharge status: Home / Self Care | Payer: 59 | Source: Ambulatory Visit | Attending: Obstetrics & Gynecology | Admitting: Obstetrics & Gynecology

## 2019-11-06 ENCOUNTER — Other Ambulatory Visit: Payer: Self-pay

## 2019-11-06 DIAGNOSIS — N6310 Unspecified lump in the right breast, unspecified quadrant: Secondary | ICD-10-CM

## 2019-11-06 DIAGNOSIS — N6320 Unspecified lump in the left breast, unspecified quadrant: Secondary | ICD-10-CM

## 2021-06-06 ENCOUNTER — Other Ambulatory Visit: Payer: Self-pay

## 2021-06-06 ENCOUNTER — Emergency Department (HOSPITAL_BASED_OUTPATIENT_CLINIC_OR_DEPARTMENT_OTHER)
Admission: EM | Admit: 2021-06-06 | Discharge: 2021-06-07 | Disposition: A | Discharge status: Home / Self Care | Payer: 59 | Attending: Emergency Medicine | Admitting: Emergency Medicine

## 2021-06-06 ENCOUNTER — Encounter (HOSPITAL_BASED_OUTPATIENT_CLINIC_OR_DEPARTMENT_OTHER): Payer: Self-pay

## 2021-06-06 DIAGNOSIS — M546 Pain in thoracic spine: Secondary | ICD-10-CM | POA: Insufficient documentation

## 2021-06-06 NOTE — ED Triage Notes (Signed)
Midline thoracic back pain x 6-7 days. Sts pain generally worst in evenings but has been constant all day.   Was cleaning house and working in flower beds prior to back pain.

## 2021-06-07 ENCOUNTER — Emergency Department (HOSPITAL_BASED_OUTPATIENT_CLINIC_OR_DEPARTMENT_OTHER): Payer: 59

## 2021-06-07 LAB — URINALYSIS, ROUTINE W REFLEX MICROSCOPIC
Bilirubin Urine: NEGATIVE
Glucose, UA: NEGATIVE mg/dL
Nitrite: NEGATIVE
Protein, ur: 30 mg/dL — AB
Specific Gravity, Urine: >1.046 — ABNORMAL HIGH (ref 1.005–1.030)
pH: 6 (ref 5.0–8.0)

## 2021-06-07 LAB — PREGNANCY, URINE: Preg Test, Ur: NEGATIVE

## 2021-06-07 MED ORDER — METHOCARBAMOL 500 MG PO TABS: 500.0000 mg | ORAL_TABLET | Freq: Two times a day (BID) | ORAL | 0 refills | Status: AC

## 2021-06-07 MED ORDER — METHOCARBAMOL 500 MG PO TABS
500.0000 mg | ORAL_TABLET | Freq: Once | ORAL | Status: AC
  Administered 2021-06-07: 500 mg via ORAL
  Filled 2021-06-07: qty 1

## 2021-06-07 MED ORDER — NAPROXEN 500 MG PO TABS: 500.0000 mg | ORAL_TABLET | Freq: Two times a day (BID) | ORAL | 0 refills | Status: AC

## 2021-06-07 NOTE — ED Provider Notes (Signed)
[facility] EMERGENCY DEPT  Provider Note  CSN: [id number] Arrival date & time: 06/06/21 2341  History Chief Complaint  Patient presents with   Back Pain    Lisa Christensen is a 36 y.o. female reports about a week of mid back pain, initially worse at night but today has been persistent all day. She took some motrin prior to arrival with some improvement. No fevers, N/V, dysuria or hematuria. Pain does not radiate. No leg pain/weakness. She had been doing some house/yard work prior to onset.    Home Medications Prior to Admission medications   Medication Sig Start Date End Date Taking? Authorizing Provider  methocarbamol (ROBAXIN) 500 MG tablet Take 1 tablet (500 mg total) by mouth 2 (two) times daily. 06/07/21  Yes Pollyann Savoy, MD  naproxen (NAPROSYN) 500 MG tablet Take 1 tablet (500 mg total) by mouth 2 (two) times daily. 06/07/21  Yes Pollyann Savoy, MD  ibuprofen (ADVIL,MOTRIN) 600 MG tablet Take 1 tablet (600 mg total) by mouth every 6 (six) hours. 09/19/14   Julio Sicks, NP  oxyCODONE-acetaminophen (PERCOCET/ROXICET) 5-325 MG per tablet Take 1 tablet by mouth every 4 (four) hours as needed (for pain scale 4-7). 09/19/14   Julio Sicks, NP  Prenatal Vitamins (DIS) TABS Take one by mouth daily 12/02/11   Sondra Barges, PA-C     Allergies    Patient has no known allergies.   Review of Systems   Review of Systems Please see HPI for pertinent positives and negatives  Physical Exam BP 96/65   Pulse 77   Temp 98.1 F (36.7 C) (Oral)   Resp 18   Ht 5' (1.524 m)   Wt 54.4 kg   LMP 05/30/2021   SpO2 98%   BMI 23.44 kg/m   Physical Exam Vitals and nursing note reviewed.  Constitutional:      Appearance: Normal appearance.  HENT:     Head: Normocephalic and atraumatic.     Nose: Nose normal.     Mouth/Throat:     Mouth: Mucous membranes are moist.  Eyes:     Extraocular Movements: Extraocular movements intact.     Conjunctiva/sclera: Conjunctivae  normal.  Cardiovascular:     Rate and Rhythm: Normal rate.  Pulmonary:     Effort: Pulmonary effort is normal.     Breath sounds: Normal breath sounds.  Abdominal:     General: Abdomen is flat.     Palpations: Abdomen is soft.     Tenderness: There is no abdominal tenderness.  Musculoskeletal:        General: No swelling or tenderness (no tenderness in soft tissue or bony spine). Normal range of motion.     Cervical back: Neck supple.  Skin:    General: Skin is warm and dry.  Neurological:     General: No focal deficit present.     Mental Status: She is alert.  Psychiatric:        Mood and Affect: Mood normal.    ED Results / Procedures / Treatments   EKG None  Procedures Procedures  Medications Ordered in the ED Medications  methocarbamol (ROBAXIN) tablet 500 mg (500 mg Oral Given 06/07/21 0024)    Initial Impression and Plan  Patient with nonspecific back pain, no red flags. Having pain worse at night, that has woken her from sleep, possibly due to renal colic, but she looks comfortable now. Will check UA and give a dose of robaxin.   ED Course  Clinical Course as of 06/07/21 0159  Mon Jun 07, 2021  0101 UA neg for infection, but is concentrated, possibly some dehydration. She is still having 6/10 pain. Will check CT to rule out renal stone.  [CS]  0127 I personally viewed the images from radiology studies and agree with radiologist interpretation: No acute process. There is cholelithiasis and nonobstructive renal stone not likely contributing to pain. Patient aware of these incidental findings. Plan discharge with Rx for Naprosyn, Robaxin and PCP follow up.   [CS]    Clinical Course User Index [CS] Pollyann Savoy, MD     MDM Rules/Calculators/A&P Medical Decision Making Amount and/or Complexity of Data Reviewed Labs: ordered. Decision-making details documented in ED Course. Radiology: ordered and independent interpretation performed. Decision-making  details documented in ED Course.  Risk Prescription drug management.    Final Clinical Impression(s) / ED Diagnoses Final diagnoses:  Acute bilateral thoracic back pain    Rx / DC Orders ED Discharge Orders          Ordered    naproxen (NAPROSYN) 500 MG tablet  2 times daily        06/07/21 0132    methocarbamol (ROBAXIN) 500 MG tablet  2 times daily        06/07/21 0132             Pollyann Savoy, MD 06/07/21 [id number]

## 2021-06-07 NOTE — ED Notes (Signed)
Patient transported to CT 

## 2021-10-29 IMAGING — MG DIGITAL DIAGNOSTIC BILAT W/ TOMO W/ CAD
8 series · 8 of 24 positions shown · non-contrast
Comparison: None.

CLINICAL DATA: Follow-up right breast probable benign fibroadenoma
and left breast probable fat necrosis. History of breast cancer in a
paternal aunt diagnosed at age 65.

EXAM:
DIGITAL DIAGNOSTIC BILATERAL MAMMOGRAM WITH CAD AND TOMO
ULTRASOUND BILATERAL BREAST

[L CC synth-2D]
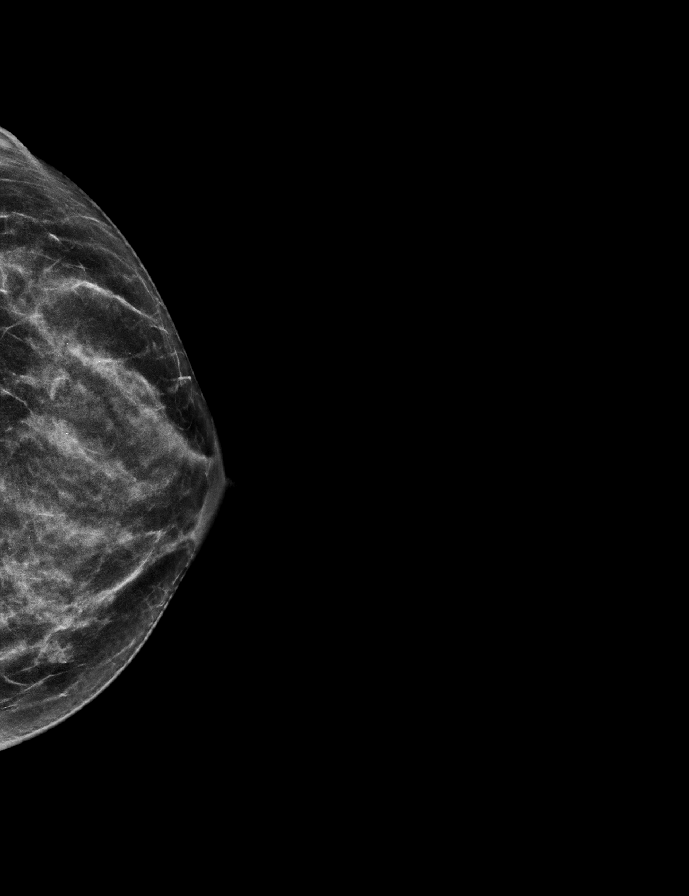

[R CC synth-2D]
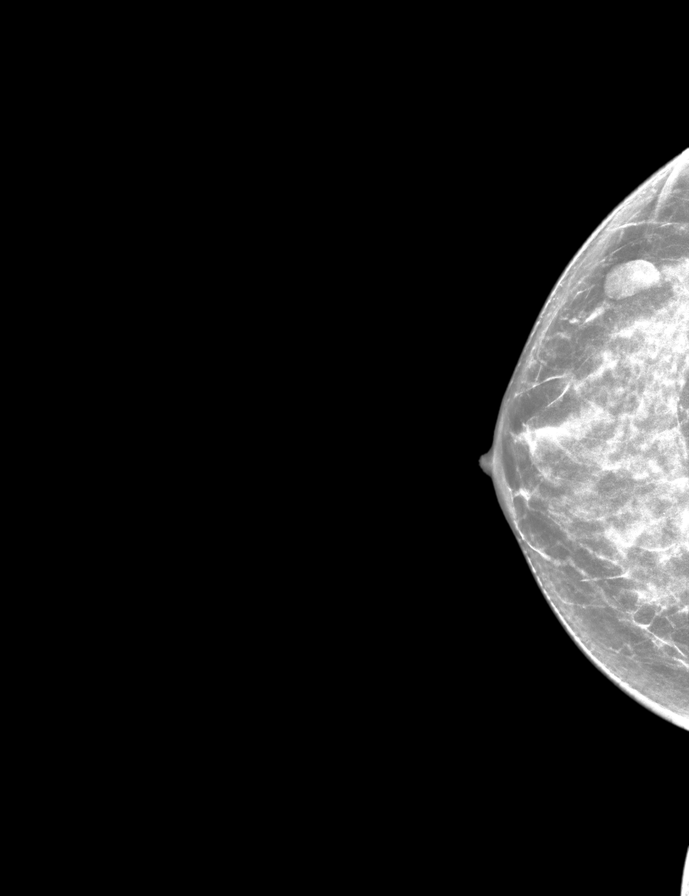

[R MLO synth-2D]
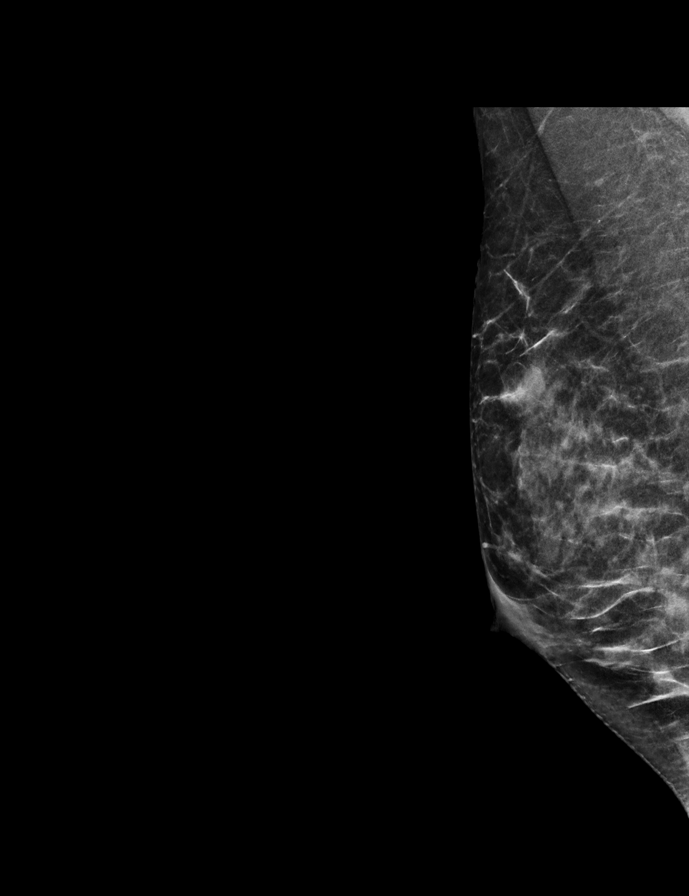

[L MLO synth-2D]
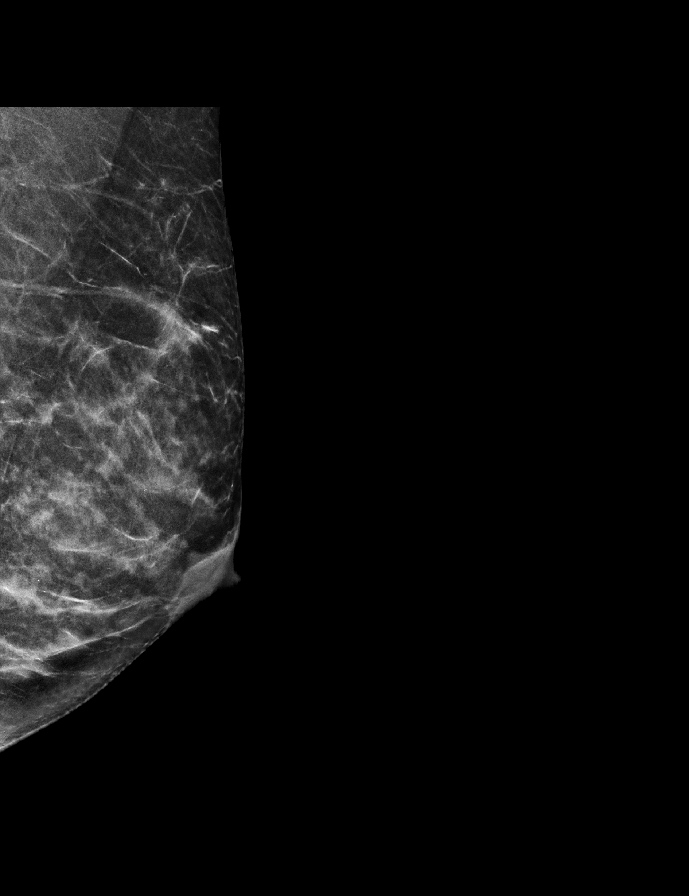

[R MLO tomo · tomo slice 27/53.0]
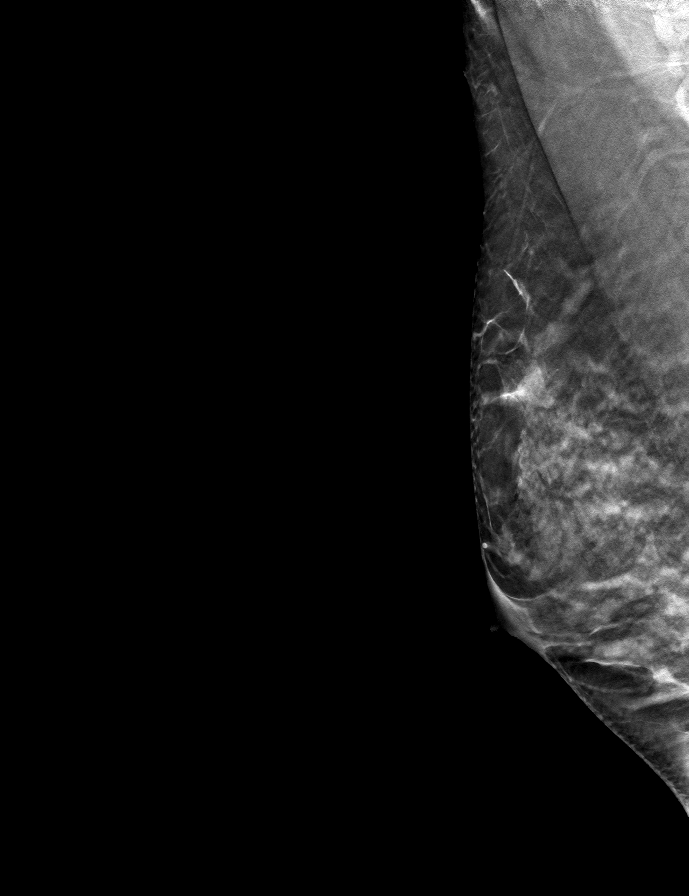

[L MLO tomo · tomo slice 26/51.0]
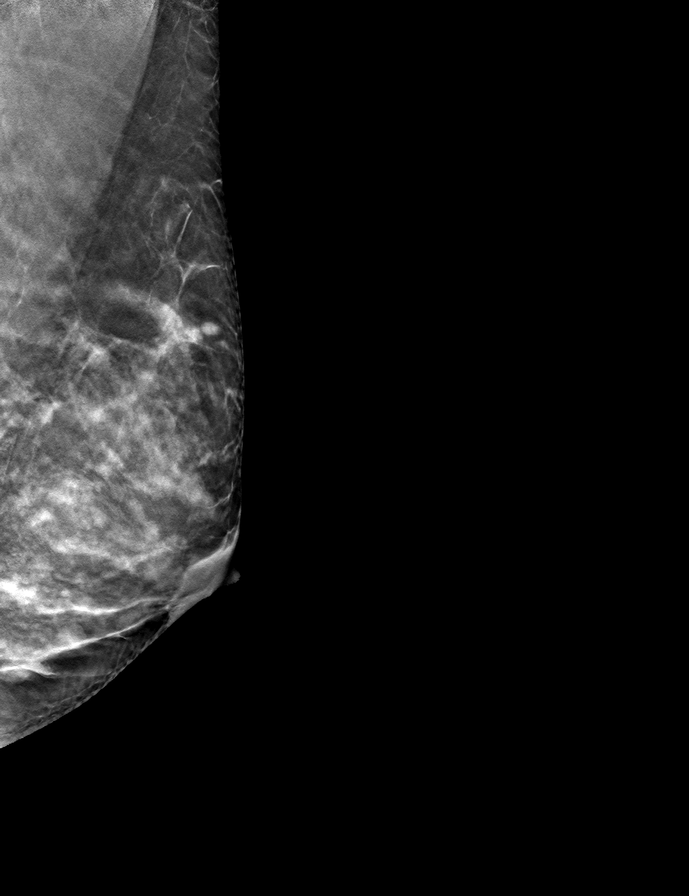

[L CC tomo · tomo slice 27/52.0]
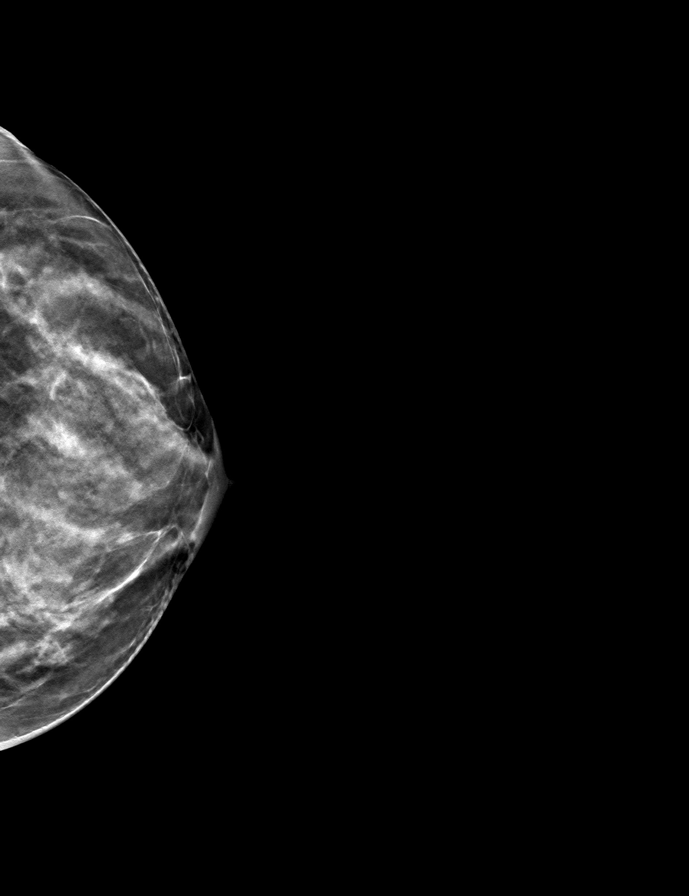

[R CC tomo · tomo slice 28/55.0]
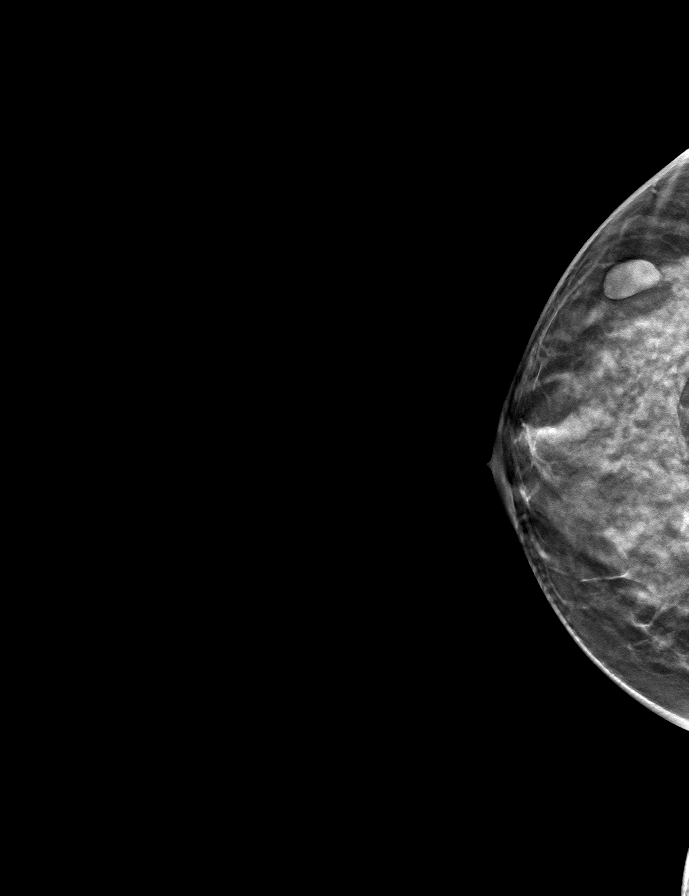

[8 of 24 positions shown; findings below may reference images not displayed]

ACR Breast Density Category d: The breast tissue is extremely dense,
which lowers the sensitivity of mammography.
FINDINGS: There is been no significant change in an oval, circumscribed mass
in the upper outer right breast.

Mammographic images were processed with CAD.

Targeted ultrasound is performed, showing a 1.5 x 1.2 x 0.5 cm oval,
horizontally oriented, circumscribed, hypoechoic mass containing
thin partial internal septations in the 10 o'clock position of the
right breast, 6 cm from the. This measured 1.4 x 1.1 x 0.5 cm on
09/27/2018 1.2 x 1.0 x 0.4 cm on the initial ultrasound dated
05/26/2017.

The previously demonstrated small, oval, hypoechoic area with
surrounding increased echogenicity in the 8:30 o'clock position of
the left breast, 4 cm from the nipple, is smaller with resolution of
the ill-defined surrounding increased echogenicity. The remaining
hypoechoic portion measures 3 x 3 x 2 mm. This measured 5 x 5 x 2 mm
on 02/01/2019.
IMPRESSION: 1. Normal slow growth of a benign fibroadenoma in the 10 o'clock
position of the right breast over a 2.5 year period of time. This
does not need further follow-up.
2. Significantly improved changes of fat necrosis in the 8:30
o'clock position of the left breast. This also does not need further
follow-up.
3. No evidence of malignancy in either breast.

RECOMMENDATION:
Annual screening mammography beginning at age 40.

I have discussed the findings and recommendations with the patient.
If applicable, a reminder letter will be sent to the patient
regarding the next appointment.

BI-RADS CATEGORY  2: Benign.

## 2023-02-10 ENCOUNTER — Other Ambulatory Visit: Payer: Self-pay | Admitting: Family

## 2023-02-10 DIAGNOSIS — N6320 Unspecified lump in the left breast, unspecified quadrant: Secondary | ICD-10-CM

## 2023-02-21 ENCOUNTER — Other Ambulatory Visit: Payer: Self-pay | Admitting: Family

## 2023-02-21 ENCOUNTER — Ambulatory Visit
Admission: RE | Admit: 2023-02-21 | Discharge: 2023-02-21 | Disposition: A | Discharge status: Home / Self Care | Payer: BC Managed Care – PPO | Source: Ambulatory Visit | Attending: Family | Admitting: Family

## 2023-02-21 ENCOUNTER — Ambulatory Visit
Admission: RE | Admit: 2023-02-21 | Discharge: 2023-02-21 | Disposition: A | Discharge status: Home / Self Care | Payer: 59 | Source: Ambulatory Visit | Attending: Family | Admitting: Family

## 2023-02-21 DIAGNOSIS — N6320 Unspecified lump in the left breast, unspecified quadrant: Secondary | ICD-10-CM

## 2023-02-24 ENCOUNTER — Ambulatory Visit
Admission: RE | Admit: 2023-02-24 | Discharge: 2023-02-24 | Disposition: A | Discharge status: Home / Self Care | Payer: BC Managed Care – PPO | Source: Ambulatory Visit | Attending: Family | Admitting: Family

## 2023-02-24 DIAGNOSIS — N6320 Unspecified lump in the left breast, unspecified quadrant: Secondary | ICD-10-CM

## 2023-02-24 HISTORY — PX: BREAST BIOPSY: SHX20

## 2023-02-27 LAB — SURGICAL PATHOLOGY

## 2023-05-31 IMAGING — CT CT RENAL STONE PROTOCOL
2 of 4 series · 15 of 46 positions shown, 17 images · non-contrast
Comparison: None Available.

CLINICAL DATA: Thoracic back pain.



[Series 2: stone full · axial · 0.55mm/px · z∈[-688,-308]mm · 12 of 90 slices shown, 14 images]
[im 7/90  soft-tissue]
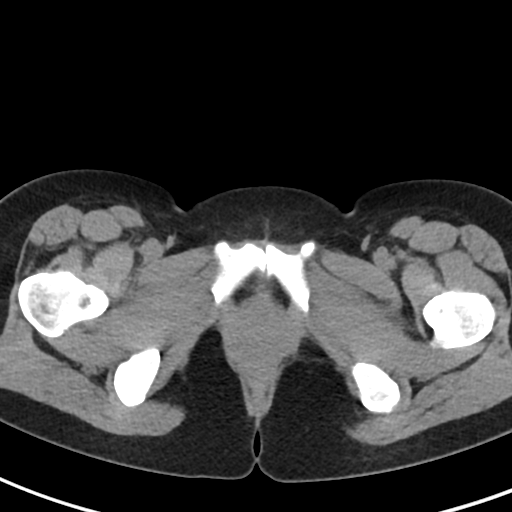
[im 7/90  bone]
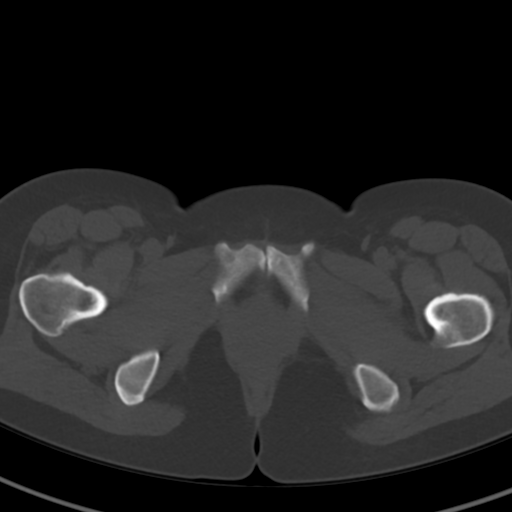
[im 14/90  soft-tissue]
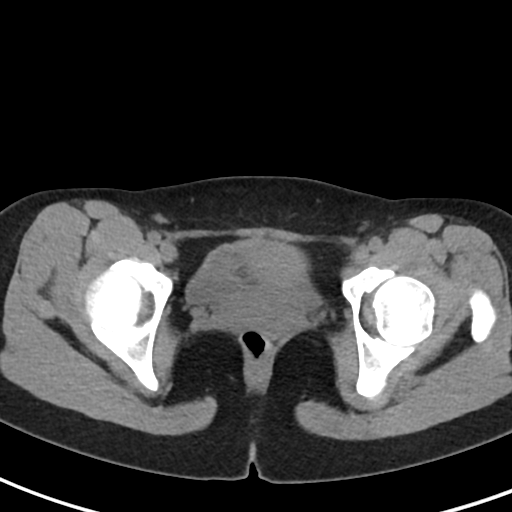
[im 21/90  soft-tissue]
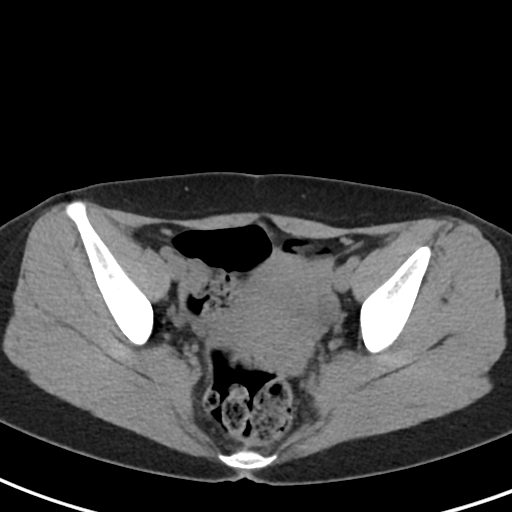
[im 28/90  soft-tissue]
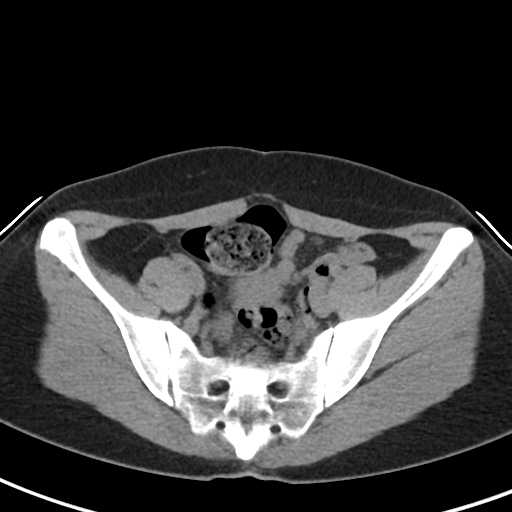
[im 35/90  soft-tissue]
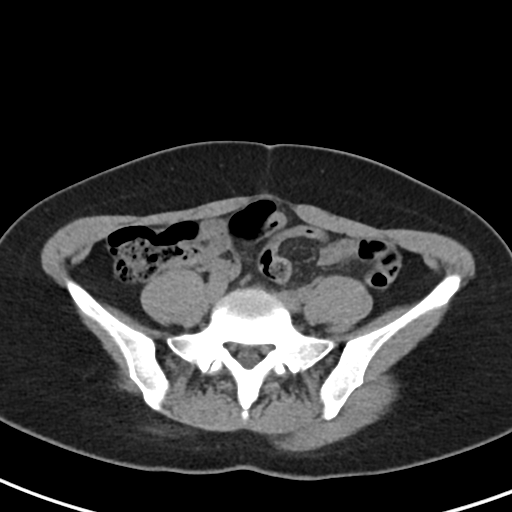
[im 42/90  soft-tissue]
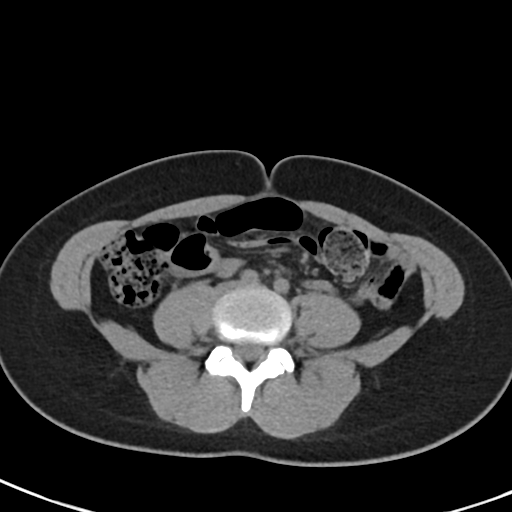
[im 48/90  soft-tissue]
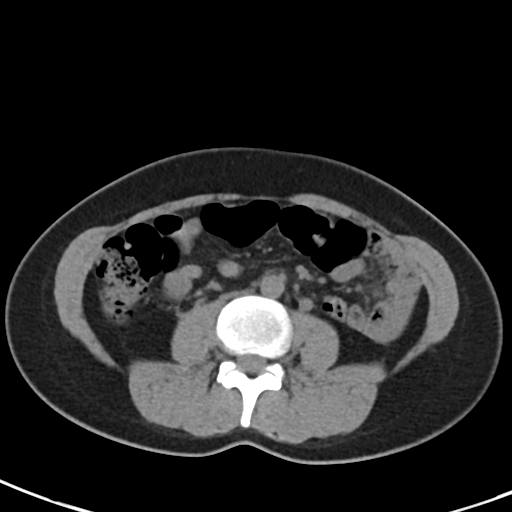
[im 55/90  soft-tissue]
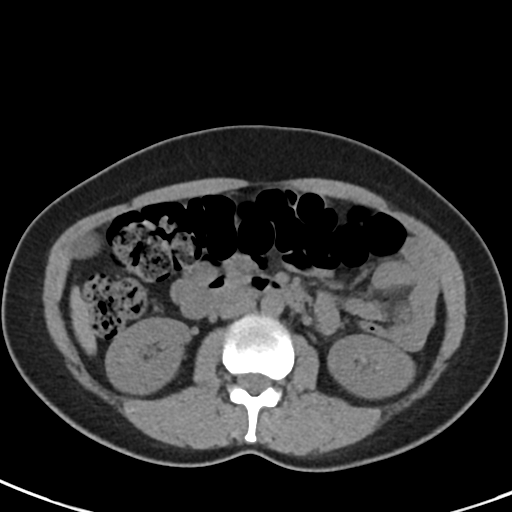
[im 62/90  soft-tissue]
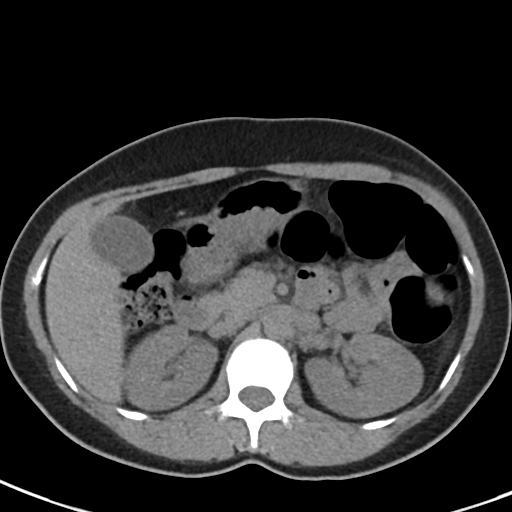
[im 62/90  bone]
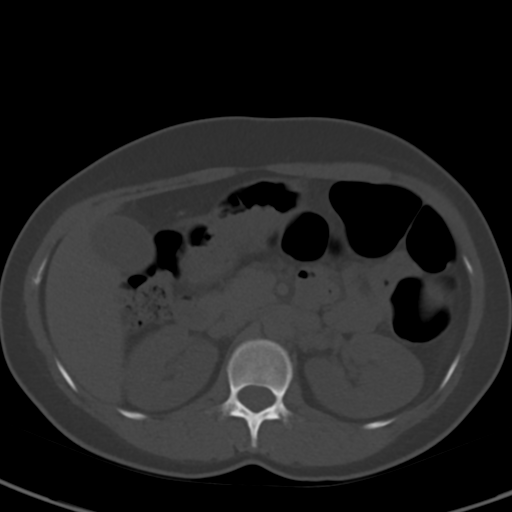
[im 69/90  soft-tissue]
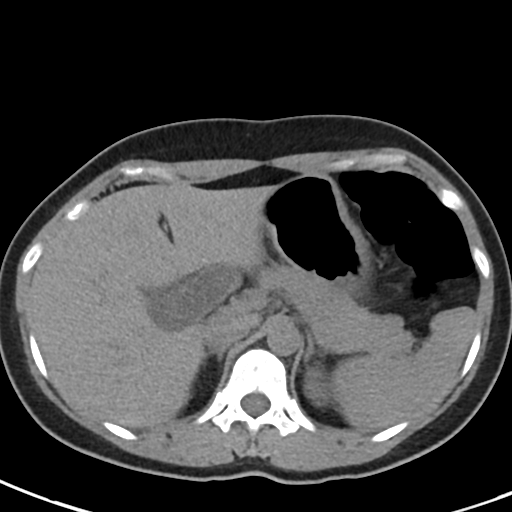
[im 76/90  soft-tissue]
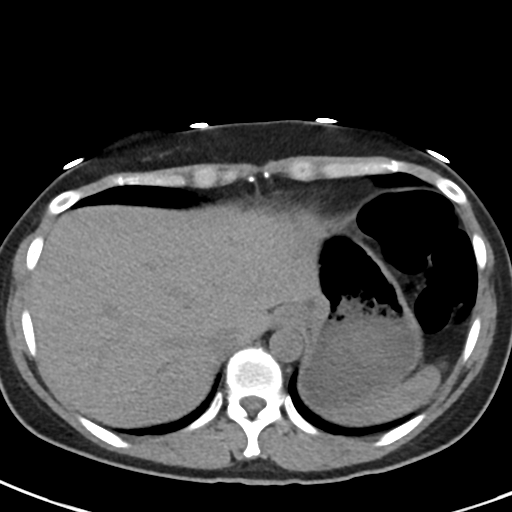
[im 83/90  soft-tissue]
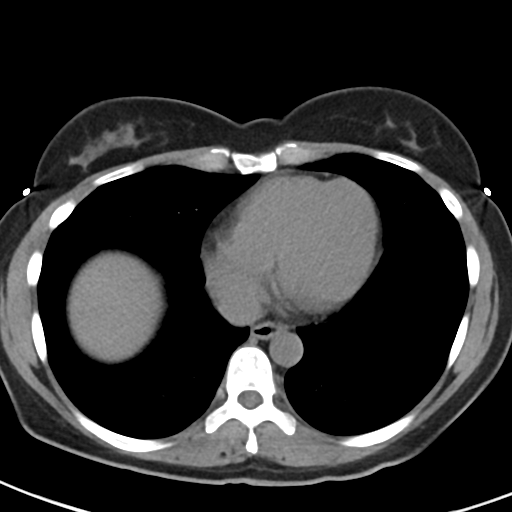

[Series 5: coronal · coronal · 0.61mm/px · 3 of 70 slices shown]
[im 24/70  soft-tissue]
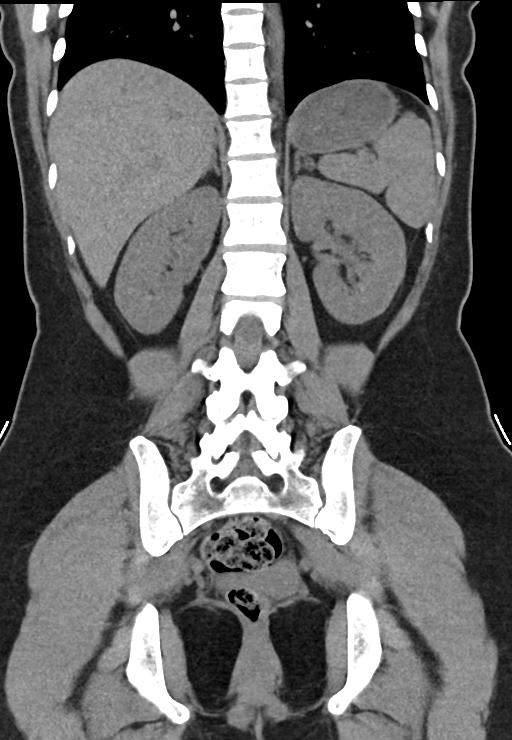
[im 31/70  soft-tissue]
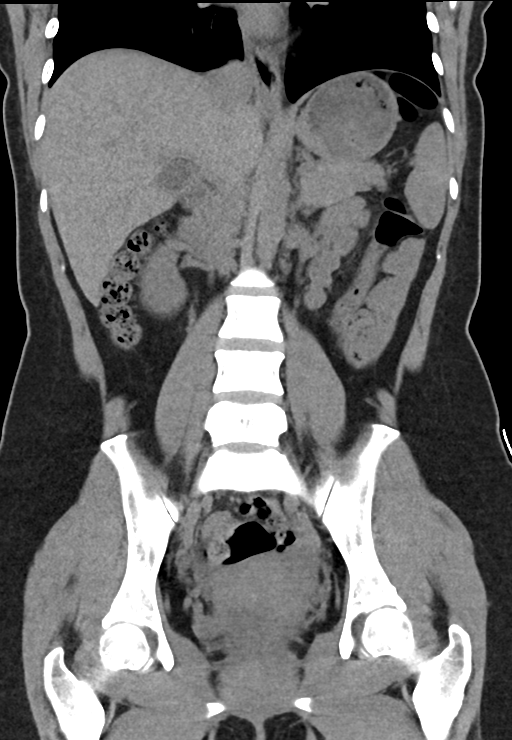
[im 39/70  soft-tissue]
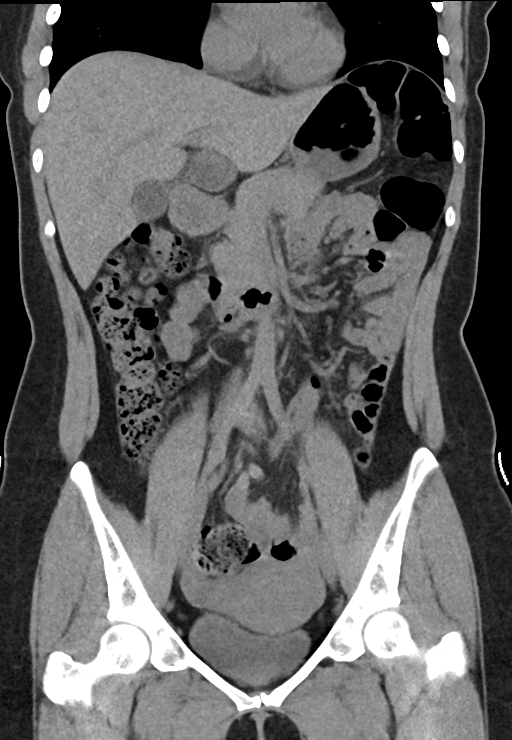

[15 of 46 positions shown; findings below may reference images not displayed]

FINDINGS: Lower chest: No acute abnormality.

Hepatobiliary: Gallstones are present. There is no biliary ductal
dilatation. There is a hypodensity in the right lobe of the liver
image [DATE] which is too small to characterize. The liver otherwise
appears within normal limits.

Pancreas: Unremarkable. No pancreatic ductal dilatation or
surrounding inflammatory changes.

Spleen: Normal in size without focal abnormality.

Adrenals/Urinary Tract: There is a single punctate nonobstructing
right renal calculus. Otherwise, the kidneys are within normal
limits. There is no hydronephrosis. The adrenal glands and bladder
are within normal limits.

Stomach/Bowel: Stomach is within normal limits. No evidence of bowel
wall thickening, distention, or inflammatory changes. Appendix is
not seen.

Vascular/Lymphatic: No significant vascular findings are present. No
enlarged abdominal or pelvic lymph nodes.

Reproductive: Uterus and bilateral adnexa are unremarkable.

Other: No abdominal wall hernia or abnormality. No abdominopelvic
ascites.

Musculoskeletal: No acute or significant osseous findings.
IMPRESSION: 1. No acute localizing process in the abdomen or pelvis.
2. Cholelithiasis.
3. Nonobstructing right renal calculus.
4. Hypodensity in the liver is too small to characterize, possibly a
cyst or hemangioma. This can be further evaluated with ultrasound or
MRI if clinically warranted.

## 2023-12-04 ENCOUNTER — Other Ambulatory Visit: Payer: Self-pay | Admitting: Obstetrics & Gynecology

## 2023-12-04 DIAGNOSIS — Z1231 Encounter for screening mammogram for malignant neoplasm of breast: Secondary | ICD-10-CM

## 2024-02-27 ENCOUNTER — Ambulatory Visit
